# Patient Record
Sex: Male | Born: 1970 | Race: White | Hispanic: No | Marital: Married | Smoking: Never smoker
Health system: Southern US, Community
[De-identification: ages and names within clinical notes are randomized; demographics above are authoritative.]

## PROBLEM LIST (undated history)

## (undated) DIAGNOSIS — E785 Hyperlipidemia, unspecified: Secondary | ICD-10-CM

## (undated) DIAGNOSIS — J302 Other seasonal allergic rhinitis: Secondary | ICD-10-CM

## (undated) DIAGNOSIS — F419 Anxiety disorder, unspecified: Secondary | ICD-10-CM

## (undated) HISTORY — DX: Hyperlipidemia, unspecified: E78.5

## (undated) HISTORY — DX: Other seasonal allergic rhinitis: J30.2

## (undated) HISTORY — DX: Anxiety disorder, unspecified: F41.9

---

## 2001-06-19 ENCOUNTER — Encounter: Admission: RE | Admit: 2001-06-19 | Discharge: 2001-06-19 | Payer: Self-pay | Admitting: Otolaryngology

## 2001-06-19 ENCOUNTER — Encounter: Payer: Self-pay | Admitting: Otolaryngology

## 2005-12-09 ENCOUNTER — Encounter (INDEPENDENT_AMBULATORY_CARE_PROVIDER_SITE_OTHER): Payer: Self-pay | Admitting: *Deleted

## 2005-12-09 ENCOUNTER — Ambulatory Visit (HOSPITAL_COMMUNITY): Admission: RE | Admit: 2005-12-09 | Discharge: 2005-12-09 | Payer: Self-pay | Admitting: General Surgery

## 2008-01-28 ENCOUNTER — Emergency Department (HOSPITAL_COMMUNITY): Admission: EM | Admit: 2008-01-28 | Discharge: 2008-01-28 | Payer: Self-pay | Admitting: Emergency Medicine

## 2010-09-21 NOTE — Op Note (Signed)
Dustin Phelps, Dustin Phelps            ACCOUNT NO.:  [id number]   MEDICAL RECORD NO.:  [id number]          PATIENT TYPE:  AMB   LOCATION:  DAY                          FACILITY:  [facility]   PHYSICIAN:  Timothy E. Earlene Plater, M.D. DATE OF BIRTH:  [birth year]   DATE OF PROCEDURE:  12/09/2005  DATE OF DISCHARGE:                                 OPERATIVE REPORT   PREOPERATIVE DIAGNOSIS:  Prolapsing hemorrhoids.   POSTOPERATIVE DIAGNOSIS:  Prolapsing hemorrhoids.   PROCEDURE:  Hemorrhoidectomy, band ligation.   SURGEON:  Timothy E. Earlene Plater, M.D.   ANESTHESIA:  General.   HISTORY:  Mr. Supple is otherwise a very healthy 40 year old with no risk  factors for hemorrhoids.  He has a single prolapsing hemorrhoid left lateral  that is annoying, difficulty to cleanse, and getting larger.  He has finally  decided to proceed with surgery after several consultations.  He had been  carefully informed, he was seen and identified today, and the permit is  signed.   DESCRIPTION:  He is taken to the operating room and placed supine.  General  LMA anesthesia provided.  He was placed in lithotomy, perianal area  inspected, and then prepped and draped in the usual fashion.  A large  prolapsing left lateral internal hemorrhoid and large external component  were present.  There was a small external component right posterior and a  second degree internal hemorrhoid right exterior.  Since we have agreed on a  single excision, the anus was _________ injected with Marcaine, epinephrine  and Wydase.  This was massaged in well.  The operating anoscope inserted.  The left lateral hemorrhoid carefully evaluated then excised with a small  ellipse as possible, removing the hemorrhoid and varicosities, undermining  of the edges with removal of further varicosities especially superficially.  The wound was then closed with a running 2-0 chromic suture.  Bleeding was  controlled, and there were no complications.  Sphincter  intact.   A rubber-band ligation was placed on the right posterior internal component.  No other pathology was seen.  No complications.  Counts correct.  He  tolerated it well.  Gelfoam gauze and dry sterile dressing applied.   Careful written and verbal instructions given to him and his wife and will  be seen and followed as an outpatient.      Timothy E. Earlene Plater, M.D.  Electronically Signed     TED/MEDQ  D:  12/09/2005  T:  12/09/2005  Job:  [id number]   cc:   Donia Guiles, M.D.  Fax: [phone]

## 2011-02-11 ENCOUNTER — Inpatient Hospital Stay (INDEPENDENT_AMBULATORY_CARE_PROVIDER_SITE_OTHER)
Admission: RE | Admit: 2011-02-11 | Discharge: 2011-02-11 | Disposition: A | Discharge status: Home / Self Care | Payer: 59 | Source: Ambulatory Visit | Attending: Family Medicine | Admitting: Family Medicine

## 2011-02-11 ENCOUNTER — Ambulatory Visit (INDEPENDENT_AMBULATORY_CARE_PROVIDER_SITE_OTHER): Payer: 59

## 2011-02-11 DIAGNOSIS — J45909 Unspecified asthma, uncomplicated: Secondary | ICD-10-CM

## 2012-04-09 ENCOUNTER — Emergency Department (INDEPENDENT_AMBULATORY_CARE_PROVIDER_SITE_OTHER)
Admission: EM | Admit: 2012-04-09 | Discharge: 2012-04-09 | Disposition: A | Discharge status: Home / Self Care | Payer: Worker's Compensation | Source: Home / Self Care | Attending: Emergency Medicine | Admitting: Emergency Medicine

## 2012-04-09 ENCOUNTER — Emergency Department (INDEPENDENT_AMBULATORY_CARE_PROVIDER_SITE_OTHER): Payer: Worker's Compensation

## 2012-04-09 ENCOUNTER — Encounter (HOSPITAL_COMMUNITY): Payer: Self-pay | Admitting: *Deleted

## 2012-04-09 DIAGNOSIS — S61219A Laceration without foreign body of unspecified finger without damage to nail, initial encounter: Secondary | ICD-10-CM

## 2012-04-09 DIAGNOSIS — S61209A Unspecified open wound of unspecified finger without damage to nail, initial encounter: Secondary | ICD-10-CM

## 2012-04-09 DIAGNOSIS — Z23 Encounter for immunization: Secondary | ICD-10-CM

## 2012-04-09 MED ORDER — TETANUS-DIPHTH-ACELL PERTUSSIS 5-2.5-18.5 LF-MCG/0.5 IM SUSP
0.5000 mL | Freq: Once | INTRAMUSCULAR | Status: AC
  Administered 2012-04-09: 0.5 mL via INTRAMUSCULAR

## 2012-04-09 MED ORDER — TETANUS-DIPHTH-ACELL PERTUSSIS 5-2.5-18.5 LF-MCG/0.5 IM SUSP
INTRAMUSCULAR | Status: AC
  Filled 2012-04-09: qty 0.5

## 2012-04-09 NOTE — ED Notes (Signed)
Dressed with coverlet bandaid and 4 prong finger splint applied with coban.

## 2012-04-09 NOTE — ED Provider Notes (Signed)
Chief Complaint  Patient presents with  . 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 He has an evulsion of the pad of the finger. This is very superficial and it appears to be some blood under the flap. He states the flap was completely peeled back initially. It's painful right now to touch but he has a full range of motion of all his joints and no numbness or tingling.  Review of Systems:  Other than noted above, the patient denies any of the following symptoms: Systemic:  No fever or chills. Musculoskeletal:  No joint pain or decreased range of motion. Neuro:  No numbness, tingling, or weakness.  PMFSH:  Past medical history, family history, social history, meds, and allergies were reviewed.  Physical Exam:   Vital signs:  BP 164/72  Pulse 69  Temp 98.6 F (37 C) (Oral)  Resp 16  SpO2 99% Ext:  There is a 1 cm flap laceration of the tip of the finger. This is very superficial involving only the superficial layers of the skin. It's mildly tender to palpation. There appears to be some blood underneath the flap.  All joints had a full ROM without pain.  Pulses were full.  Good capillary refill in all digits.  No edema. Neurological:  Alert and oriented.  No muscle weakness.  Sensation was intact to light touch.   Procedure: Verbal informed consent was obtained.  The patient was informed of the risks and benefits of the procedure and understands and accepts.  Identity of the patient was verified verbally and by wristband.   The laceration area described above was prepped with saline. All visible blood was drained out manually. This is too thin to sew with sutures, so it was glued with Dermabond. The patient tolerated this well. He was given a finger splint to use. I suggested he let the Dermabond come off on its own. He can wash with soap and water. He did not need to  apply any antibiotic ointment.  Medications given in [facility]:  He was given a Tdap vaccine.  Assessment:  The encounter diagnosis was Finger laceration.  Plan:   1.  The following meds were prescribed:   New Prescriptions   No medications on file   2.  The patient was instructed in wound care and pain control, and handouts were given. 3.  The patient was told to return if any sign of infection.     Reuben Likes, MD 04/09/12 2139

## 2012-04-09 NOTE — ED Notes (Signed)
Dropped 125 lbs. 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  Avulsion laceration to tip of finger ventral surface with blood under flap.  Good ROM @ DIP joint.  Bleeding stopped.

## 2012-12-01 IMAGING — CR DG CHEST 2V
3 series · 3 of 3 positions shown · non-contrast
Comparison: 01/28/2008.

CLINICAL DATA: Cough. Shortness of breath.  Nonsmoker.

CHEST - 2 VIEW

[view not recorded (1 of 3)]
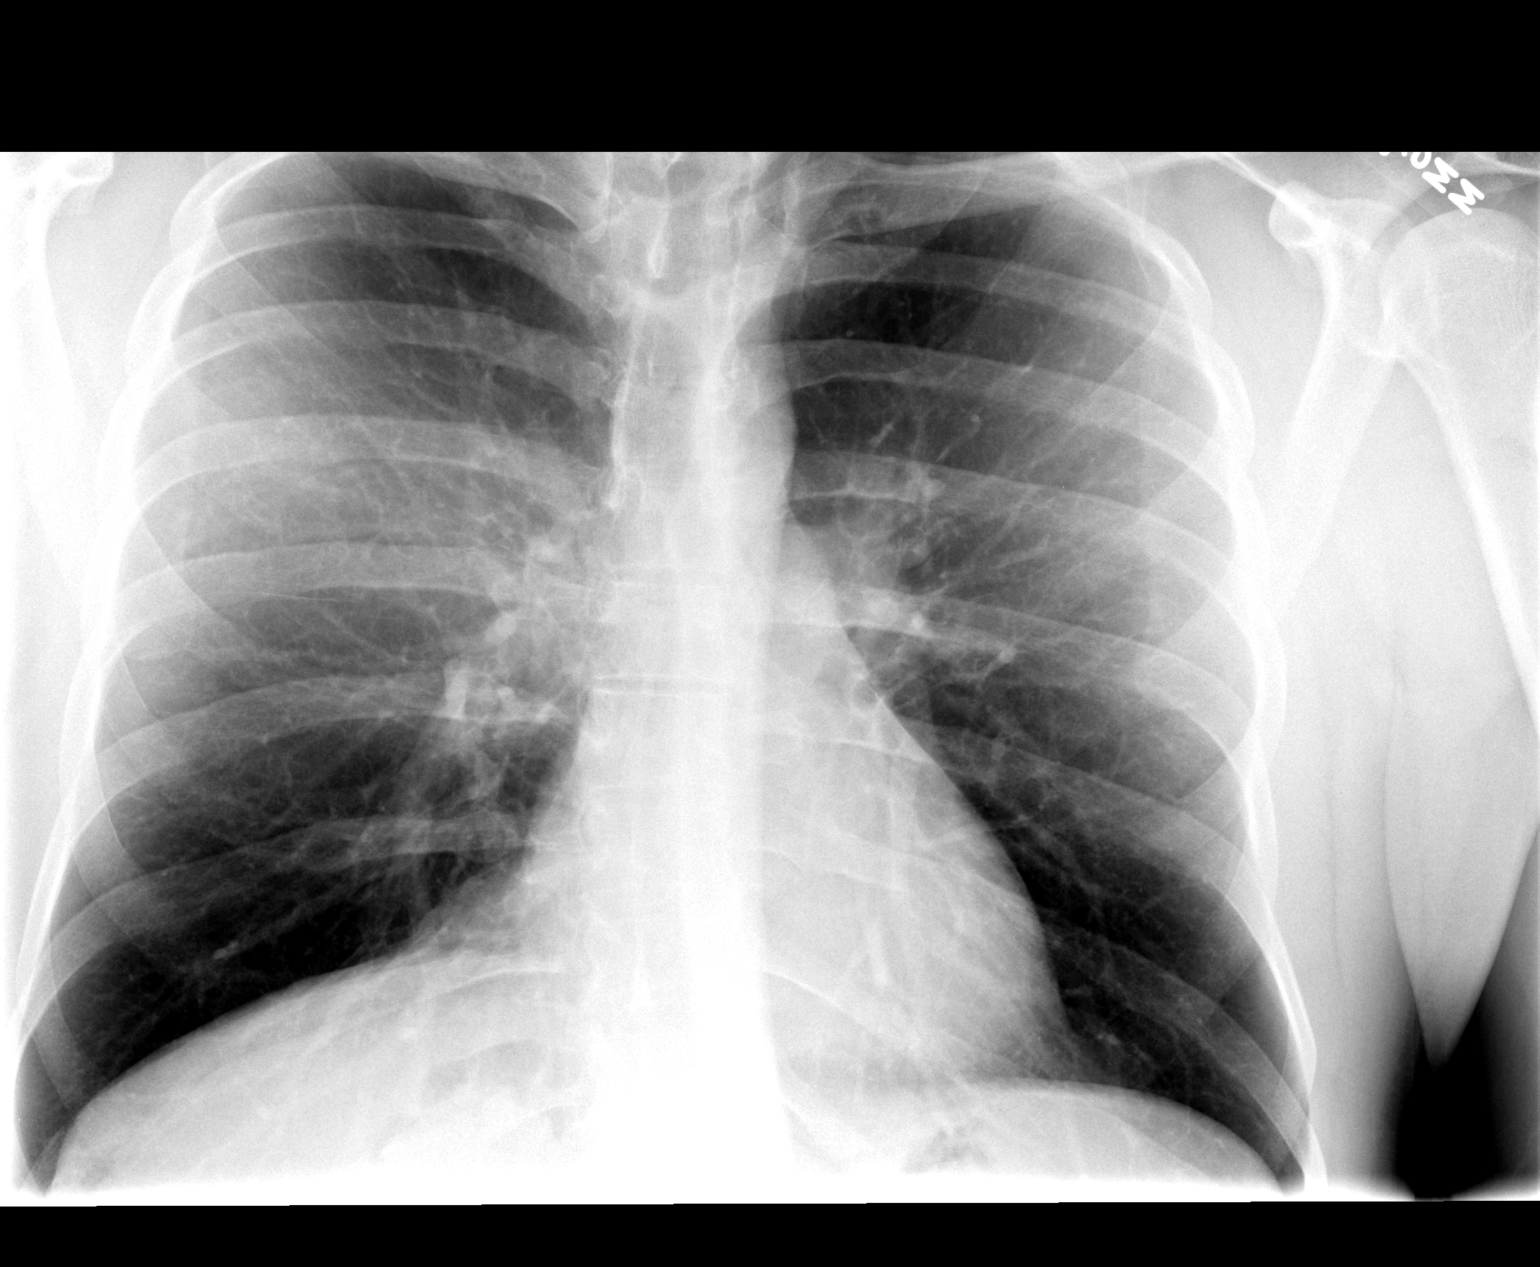

[view not recorded (2 of 3)]
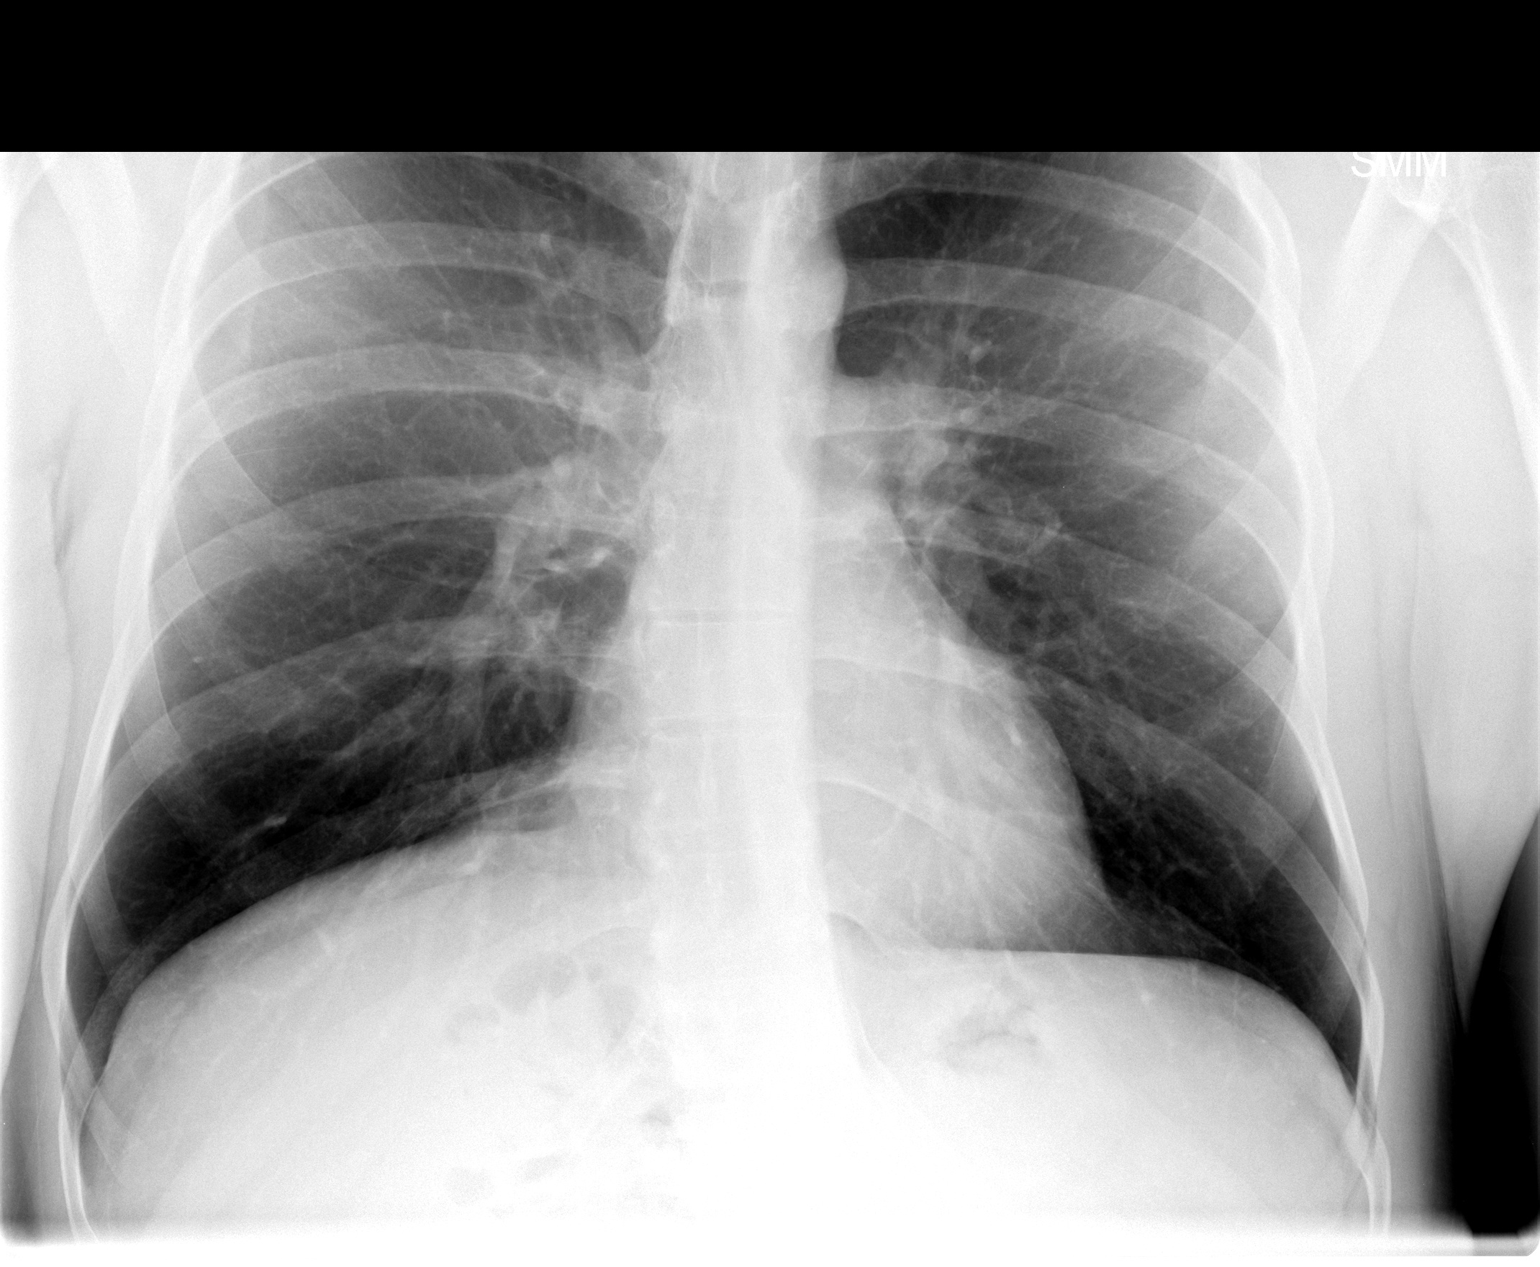

[view not recorded (3 of 3)]
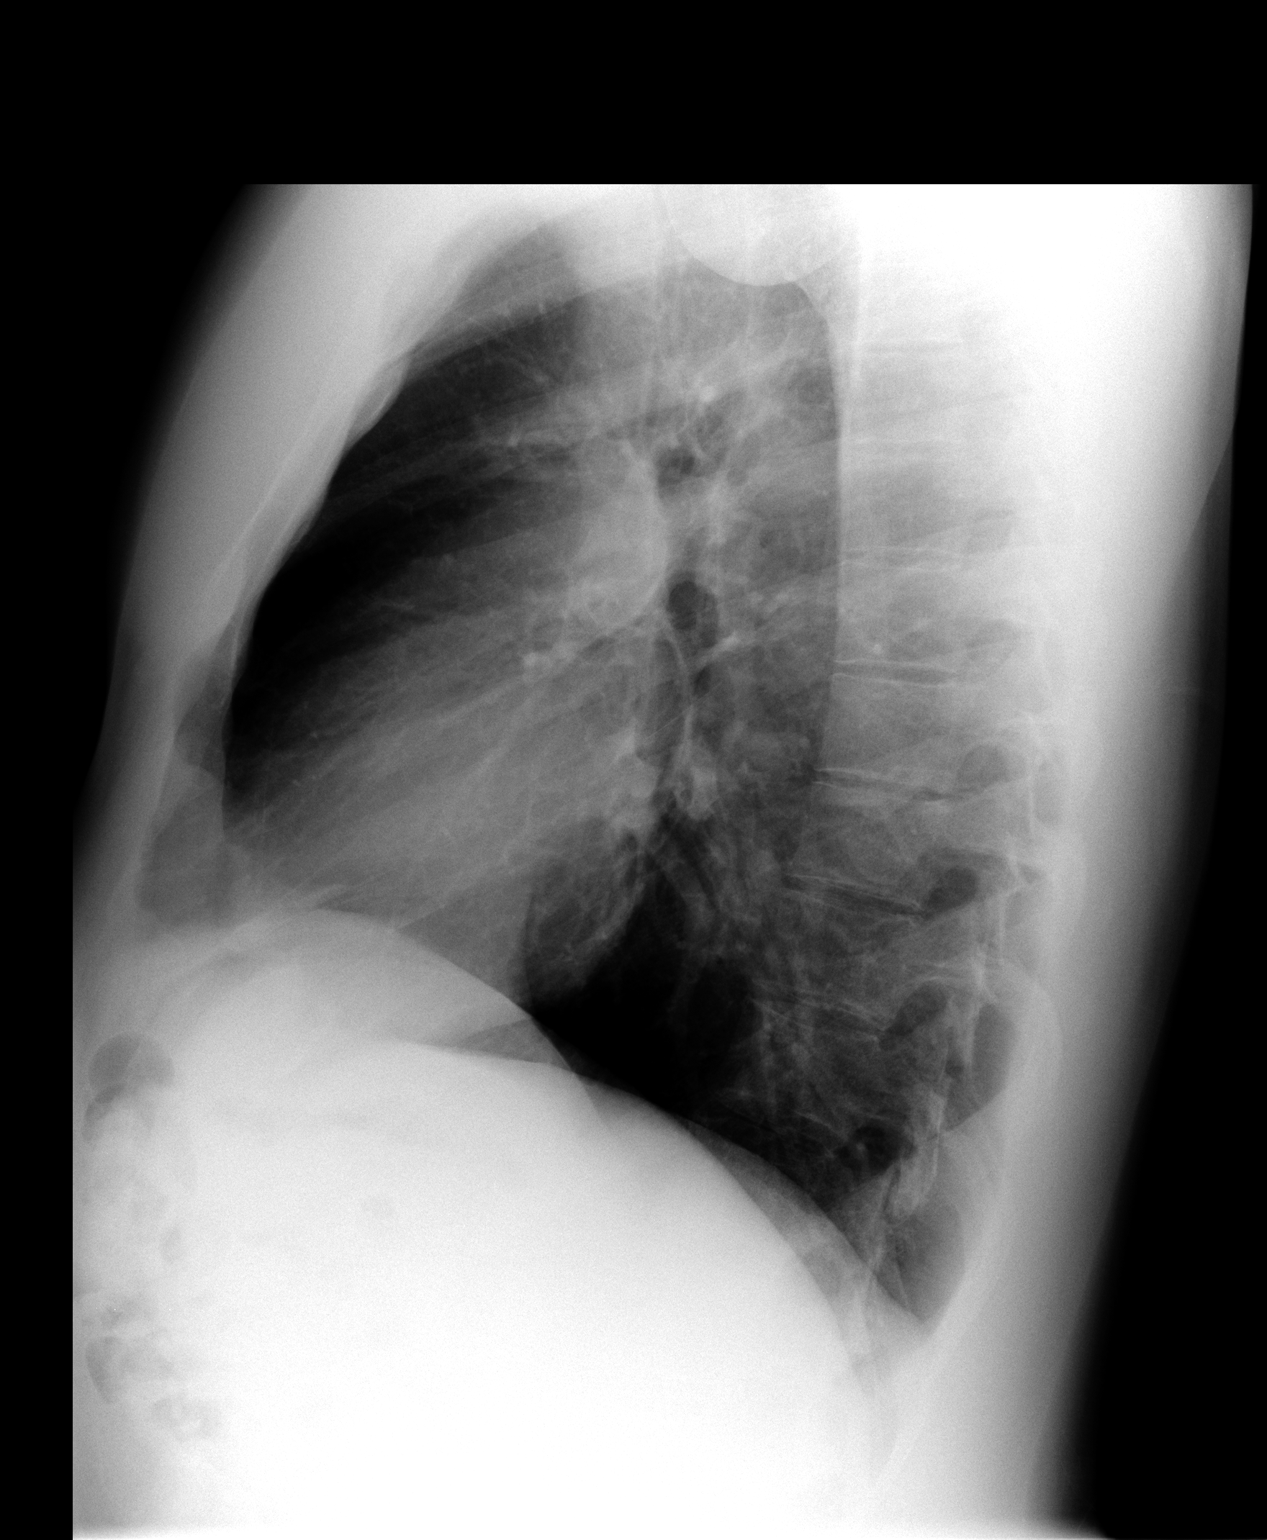

[3 of 3 positions shown; findings below may reference images not displayed]

FINDINGS: No infiltrate, congestive heart failure or pneumothorax.
Mild dextroscoliosis thoracic spine.  Mediastinal and cardiac
silhouette stable and within normal limits.
IMPRESSION: No acute abnormality.

## 2016-05-14 DIAGNOSIS — E782 Mixed hyperlipidemia: Secondary | ICD-10-CM | POA: Diagnosis not present

## 2016-05-14 DIAGNOSIS — Z0000 Encounter for general adult medical examination without abnormal findings: Secondary | ICD-10-CM | POA: Diagnosis not present

## 2016-05-14 DIAGNOSIS — R5383 Other fatigue: Secondary | ICD-10-CM | POA: Diagnosis not present

## 2016-09-20 DIAGNOSIS — J329 Chronic sinusitis, unspecified: Secondary | ICD-10-CM | POA: Diagnosis not present

## 2017-05-27 DIAGNOSIS — H5200 Hypermetropia, unspecified eye: Secondary | ICD-10-CM | POA: Diagnosis not present

## 2017-05-30 DIAGNOSIS — I10 Essential (primary) hypertension: Secondary | ICD-10-CM | POA: Diagnosis not present

## 2017-05-30 DIAGNOSIS — Z0000 Encounter for general adult medical examination without abnormal findings: Secondary | ICD-10-CM | POA: Diagnosis not present

## 2017-06-17 DIAGNOSIS — R945 Abnormal results of liver function studies: Secondary | ICD-10-CM | POA: Diagnosis not present

## 2017-06-17 DIAGNOSIS — R740 Nonspecific elevation of levels of transaminase and lactic acid dehydrogenase [LDH]: Secondary | ICD-10-CM | POA: Diagnosis not present

## 2017-06-19 ENCOUNTER — Other Ambulatory Visit: Payer: Self-pay | Admitting: Family Medicine

## 2017-06-19 DIAGNOSIS — R945 Abnormal results of liver function studies: Principal | ICD-10-CM

## 2017-06-19 DIAGNOSIS — R7989 Other specified abnormal findings of blood chemistry: Secondary | ICD-10-CM

## 2017-06-30 ENCOUNTER — Other Ambulatory Visit: Payer: Self-pay

## 2017-07-07 ENCOUNTER — Ambulatory Visit
Admission: RE | Admit: 2017-07-07 | Discharge: 2017-07-07 | Disposition: A | Discharge status: Home / Self Care | Payer: 59 | Source: Ambulatory Visit | Attending: Family Medicine | Admitting: Family Medicine

## 2017-07-07 DIAGNOSIS — R945 Abnormal results of liver function studies: Principal | ICD-10-CM

## 2017-07-07 DIAGNOSIS — R7989 Other specified abnormal findings of blood chemistry: Secondary | ICD-10-CM | POA: Diagnosis not present

## 2017-07-18 DIAGNOSIS — R945 Abnormal results of liver function studies: Secondary | ICD-10-CM | POA: Diagnosis not present

## 2017-08-07 DIAGNOSIS — R945 Abnormal results of liver function studies: Secondary | ICD-10-CM | POA: Diagnosis not present

## 2017-08-29 DIAGNOSIS — R945 Abnormal results of liver function studies: Secondary | ICD-10-CM | POA: Diagnosis not present

## 2017-12-08 DIAGNOSIS — E782 Mixed hyperlipidemia: Secondary | ICD-10-CM | POA: Diagnosis not present

## 2017-12-08 DIAGNOSIS — I10 Essential (primary) hypertension: Secondary | ICD-10-CM | POA: Diagnosis not present

## 2018-01-06 DIAGNOSIS — R7301 Impaired fasting glucose: Secondary | ICD-10-CM | POA: Diagnosis not present

## 2018-01-06 DIAGNOSIS — N179 Acute kidney failure, unspecified: Secondary | ICD-10-CM | POA: Diagnosis not present

## 2018-06-17 DIAGNOSIS — I10 Essential (primary) hypertension: Secondary | ICD-10-CM | POA: Diagnosis not present

## 2018-06-17 DIAGNOSIS — Z0000 Encounter for general adult medical examination without abnormal findings: Secondary | ICD-10-CM | POA: Diagnosis not present

## 2018-06-17 DIAGNOSIS — R7303 Prediabetes: Secondary | ICD-10-CM | POA: Diagnosis not present

## 2019-11-15 ENCOUNTER — Other Ambulatory Visit: Payer: Self-pay | Admitting: Family Medicine

## 2019-11-15 ENCOUNTER — Ambulatory Visit
Admission: RE | Admit: 2019-11-15 | Discharge: 2019-11-15 | Disposition: A | Discharge status: Home / Self Care | Payer: 59 | Source: Ambulatory Visit | Attending: Family Medicine | Admitting: Family Medicine

## 2019-11-15 DIAGNOSIS — R0602 Shortness of breath: Secondary | ICD-10-CM

## 2020-09-26 ENCOUNTER — Encounter: Payer: Self-pay | Admitting: Gastroenterology

## 2020-11-01 ENCOUNTER — Other Ambulatory Visit: Payer: Self-pay

## 2020-11-01 ENCOUNTER — Ambulatory Visit (AMBULATORY_SURGERY_CENTER): Payer: 59

## 2020-11-01 VITALS — Ht 71.0 in | Wt 205.0 lb

## 2020-11-01 DIAGNOSIS — Z1211 Encounter for screening for malignant neoplasm of colon: Secondary | ICD-10-CM

## 2020-11-01 MED ORDER — SUTAB 1479-225-188 MG PO TABS: 1.0000 | ORAL_TABLET | ORAL | 0 refills | Status: DC

## 2020-11-01 NOTE — Progress Notes (Signed)
Pre visit completed via phone call; Patient verified name, DOB, and address;  No egg or soy allergy known to patient  No issues with past sedation with any surgeries or procedures Patient denies ever being told they had issues or difficulty with intubation  No FH of Malignant Hyperthermia No diet pills per patient No home 02 use per patient  No blood thinners per patient  Pt denies issues with constipation  No A fib or A flutter  COVID 19 guidelines implemented in PV today with Pt and RN  Pt is fully vaccinated for Covid x 2;  Coupon given to pt in PV today, Code to Pharmacy and  NO PA's for preps discussed with pt in PV today  Discussed with pt there will be an out-of-pocket cost for prep and that varies from $0 to 70 dollars   Due to the COVID-19 pandemic we are asking patients to follow certain guidelines.  Pt aware of COVID protocols and [facility] guidelines

## 2020-11-20 ENCOUNTER — Encounter: Payer: Self-pay | Admitting: Gastroenterology

## 2020-11-20 ENCOUNTER — Other Ambulatory Visit: Payer: Self-pay

## 2020-11-20 ENCOUNTER — Ambulatory Visit (AMBULATORY_SURGERY_CENTER): Payer: 59 | Admitting: Gastroenterology

## 2020-11-20 VITALS — BP 121/63 | HR 79 | Temp 97.3°F | Resp 16 | Ht 71.0 in | Wt 205.0 lb

## 2020-11-20 DIAGNOSIS — K635 Polyp of colon: Secondary | ICD-10-CM

## 2020-11-20 DIAGNOSIS — D123 Benign neoplasm of transverse colon: Secondary | ICD-10-CM

## 2020-11-20 DIAGNOSIS — K640 First degree hemorrhoids: Secondary | ICD-10-CM

## 2020-11-20 DIAGNOSIS — Z1211 Encounter for screening for malignant neoplasm of colon: Secondary | ICD-10-CM

## 2020-11-20 DIAGNOSIS — K5730 Diverticulosis of large intestine without perforation or abscess without bleeding: Secondary | ICD-10-CM

## 2020-11-20 MED ORDER — SODIUM CHLORIDE 0.9 % IV SOLN: 500.0000 mL | Freq: Once | INTRAVENOUS | Status: DC

## 2020-11-20 NOTE — Progress Notes (Signed)
Called to room to assist during endoscopic procedure.  Patient ID and intended procedure confirmed with present staff. Received instructions for my participation in the procedure from the performing physician.  

## 2020-11-20 NOTE — Op Note (Signed)
[place] Patient Name: Dustin Phelps Procedure Date: 11/20/2020 7:15 AM MRN: [id number] Endoscopist: Gerrit Heck , MD Age: 50 Referring MD:  Date of Birth: 02-May-1971 Gender: Male Account #: [id number] Procedure:                Colonoscopy Indications:              Screening for colorectal malignant neoplasm, This                            is the patient's first colonoscopy Medicines:                Monitored Anesthesia Care Procedure:                Pre-Anesthesia Assessment:                           - Prior to the procedure, a History and Physical                            was performed, and patient medications and                            allergies were reviewed. The patient's tolerance of                            previous anesthesia was also reviewed. The risks                            and benefits of the procedure and the sedation                            options and risks were discussed with the patient.                            All questions were answered, and informed consent                            was obtained. Prior Anticoagulants: The patient has                            taken no previous anticoagulant or antiplatelet                            agents. ASA Grade Assessment: II - A patient with                            mild systemic disease. After reviewing the risks                            and benefits, the patient was deemed in                            satisfactory condition to undergo the procedure.  After obtaining informed consent, the colonoscope                            was passed under direct vision. Throughout the                            procedure, the patient's blood pressure, pulse, and                            oxygen saturations were monitored continuously. The                            Olympus CF-HQ190L ([id number]) Colonoscope was                            introduced through the anus  and advanced to the the                            terminal ileum. The colonoscopy was performed                            without difficulty. The patient tolerated the                            procedure well. The quality of the bowel                            preparation was good. The terminal ileum, ileocecal                            valve, appendiceal orifice, and rectum were                            photographed. Scope In: 8:06:53 AM Scope Out: 8:28:34 AM Scope Withdrawal Time: 0 hours 17 minutes 5 seconds  Total Procedure Duration: 0 hours 21 minutes 41 seconds  Findings:                 The perianal and digital rectal examinations were                            normal.                           A single (solitary) five mm ulcer was found at the                            ileocecal valve. No bleeding was present. No                            stigmata of recent bleeding were seen. Biopsies                            were taken with a cold forceps for histology.  Estimated blood loss was minimal.                           A 2 mm polyp was found in the transverse colon. The                            polyp was sessile. The polyp was removed with a                            cold biopsy forceps. Resection and retrieval were                            complete. Estimated blood loss was minimal.                           Multiple small and large-mouthed diverticula were                            found in the sigmoid colon, descending colon,                            transverse colon and ascending colon.                           Non-bleeding internal hemorrhoids were found during                            retroflexion. The hemorrhoids were small.                           The terminal ileum appeared normal. Complications:            No immediate complications. Estimated Blood Loss:     Estimated blood loss was minimal. Impression:               - A  single (solitary) ulcer at the ileocecal valve.                            Biopsied.                           - One 2 mm polyp in the transverse colon, removed                            with a cold biopsy forceps. Resected and retrieved.                           - Diverticulosis in the sigmoid colon, in the                            descending colon, in the transverse colon and in                            the ascending colon.                           -  Non-bleeding internal hemorrhoids.                           - The examined portion of the ileum was normal. Recommendation:           - Patient has a contact number available for                            emergencies. The signs and symptoms of potential                            delayed complications were discussed with the                            patient. Return to normal activities tomorrow.                            Written discharge instructions were provided to the                            patient.                           - Resume previous diet.                           - Continue present medications.                           - Await pathology results.                           - Repeat colonoscopy in 5-10 years for surveillance                            based on pathology results.                           - Return to GI office PRN.                           - Use fiber, for example Citrucel, Fibercon, Konsyl                            or Metamucil.                           - Internal hemorrhoids were noted on this study and                            may be amenable to hemorrhoid band ligation. If you                            are interested in further treatment of these  hemorrhoids with band ligation, please contact my                            clinic to set up an appointment for evaluation and                            treatment. Gerrit Heck, MD 11/20/2020 8:33:06 AM

## 2020-11-20 NOTE — Patient Instructions (Signed)
Handout given:  polyps, diverticulosis, hemorrhoids Resume previous diet Continue current medications Await pathology results Use supplemental fiber:  citrucel, fibercon, konsyl  YOU HAD AN ENDOSCOPIC PROCEDURE TODAY AT [facility]:   Refer to the procedure report that was given to you for any specific questions about what was found during the examination.  If the procedure report does not answer your questions, please call your gastroenterologist to clarify.  If you requested that your care partner not be given the details of your procedure findings, then the procedure report has been included in a sealed envelope for you to review at your convenience later.  YOU SHOULD EXPECT: Some feelings of bloating in the abdomen. Passage of more gas than usual.  Walking can help get rid of the air that was put into your GI tract during the procedure and reduce the bloating. If you had a lower endoscopy (such as a colonoscopy or flexible sigmoidoscopy) you may notice spotting of blood in your stool or on the toilet paper. If you underwent a bowel prep for your procedure, you may not have a normal bowel movement for a few days.  Please Note:  You might notice some irritation and congestion in your nose or some drainage.  This is from the oxygen used during your procedure.  There is no need for concern and it should clear up in a day or so.  SYMPTOMS TO REPORT IMMEDIATELY:  Following lower endoscopy (colonoscopy or flexible sigmoidoscopy):  Excessive amounts of blood in the stool  Significant tenderness or worsening of abdominal pains  Swelling of the abdomen that is new, acute  Fever of 100F or higher  For urgent or emergent issues, a gastroenterologist can be reached at any hour by calling [phone]. Do not use MyChart messaging for urgent concerns.   DIET:  We do recommend a small meal at first, but then you may proceed to your regular diet.  Drink plenty of fluids but you should  avoid alcoholic beverages for 24 hours.  ACTIVITY:  You should plan to take it easy for the rest of today and you should NOT DRIVE or use heavy machinery until tomorrow (because of the sedation medicines used during the test).    FOLLOW UP: Our staff will call the number listed on your records 48-72 hours following your procedure to check on you and address any questions or concerns that you may have regarding the information given to you following your procedure. If we do not reach you, we will leave a message.  We will attempt to reach you two times.  During this call, we will ask if you have developed any symptoms of COVID 19. If you develop any symptoms (ie: fever, flu-like symptoms, shortness of breath, cough etc.) before then, please call [phone].  If you test positive for Covid 19 in the 2 weeks post procedure, please call and report this information to [place].    If any biopsies were taken you will be contacted by phone or by letter within the next 1-3 weeks.  Please call us at [phone] if you have not heard about the biopsies in 3 weeks.   SIGNATURES/CONFIDENTIALITY: You and/or your care partner have signed paperwork which will be entered into your electronic medical record.  These signatures attest to the fact that that the information above on your After Visit Summary has been reviewed and is understood.  Full responsibility of the confidentiality of this discharge information lies with you and/or your  care-partner.  

## 2020-11-20 NOTE — Progress Notes (Signed)
To PACU, VSS. Report to [email or web address] 

## 2020-11-20 NOTE — Progress Notes (Signed)
Pt's states no medical or surgical changes since previsit or office visit. 

## 2020-11-22 ENCOUNTER — Telehealth: Payer: Self-pay | Admitting: *Deleted

## 2020-11-22 NOTE — Telephone Encounter (Signed)
  Follow up Call-  Call back number 11/20/2020  Post procedure Call Back phone  # [phone]  Permission to leave phone message Yes  Some recent data might be hidden     Patient questions:  Do you have a fever, pain , or abdominal swelling? No. Pain Score  0 *  Have you tolerated food without any problems? Yes.    Have you been able to return to your normal activities? Yes.    Do you have any questions about your discharge instructions: Diet   No. Medications  No. Follow up visit  No.  Do you have questions or concerns about your Care? No.  Actions: * If pain score is 4 or above: No action needed, pain <4.Have you developed a fever since your procedure? no  2.   Have you had an respiratory symptoms (SOB or cough) since your procedure? no  3.   Have you tested positive for COVID 19 since your procedure no  4.   Have you had any family members/close contacts diagnosed with the COVID 19 since your procedure?  no   If yes to any of these questions please route to Joylene John, RN and Joella Prince, RN

## 2020-11-24 ENCOUNTER — Encounter: Payer: Self-pay | Admitting: Gastroenterology

## 2021-09-04 IMAGING — CR DG CHEST 2V
2 series · 2 of 2 positions shown · non-contrast
Comparison: 02/11/2011

CLINICAL DATA: Shortness of breath, nonproductive cough for 2 weeks

EXAM:
CHEST - 2 VIEW

[w chest pa]
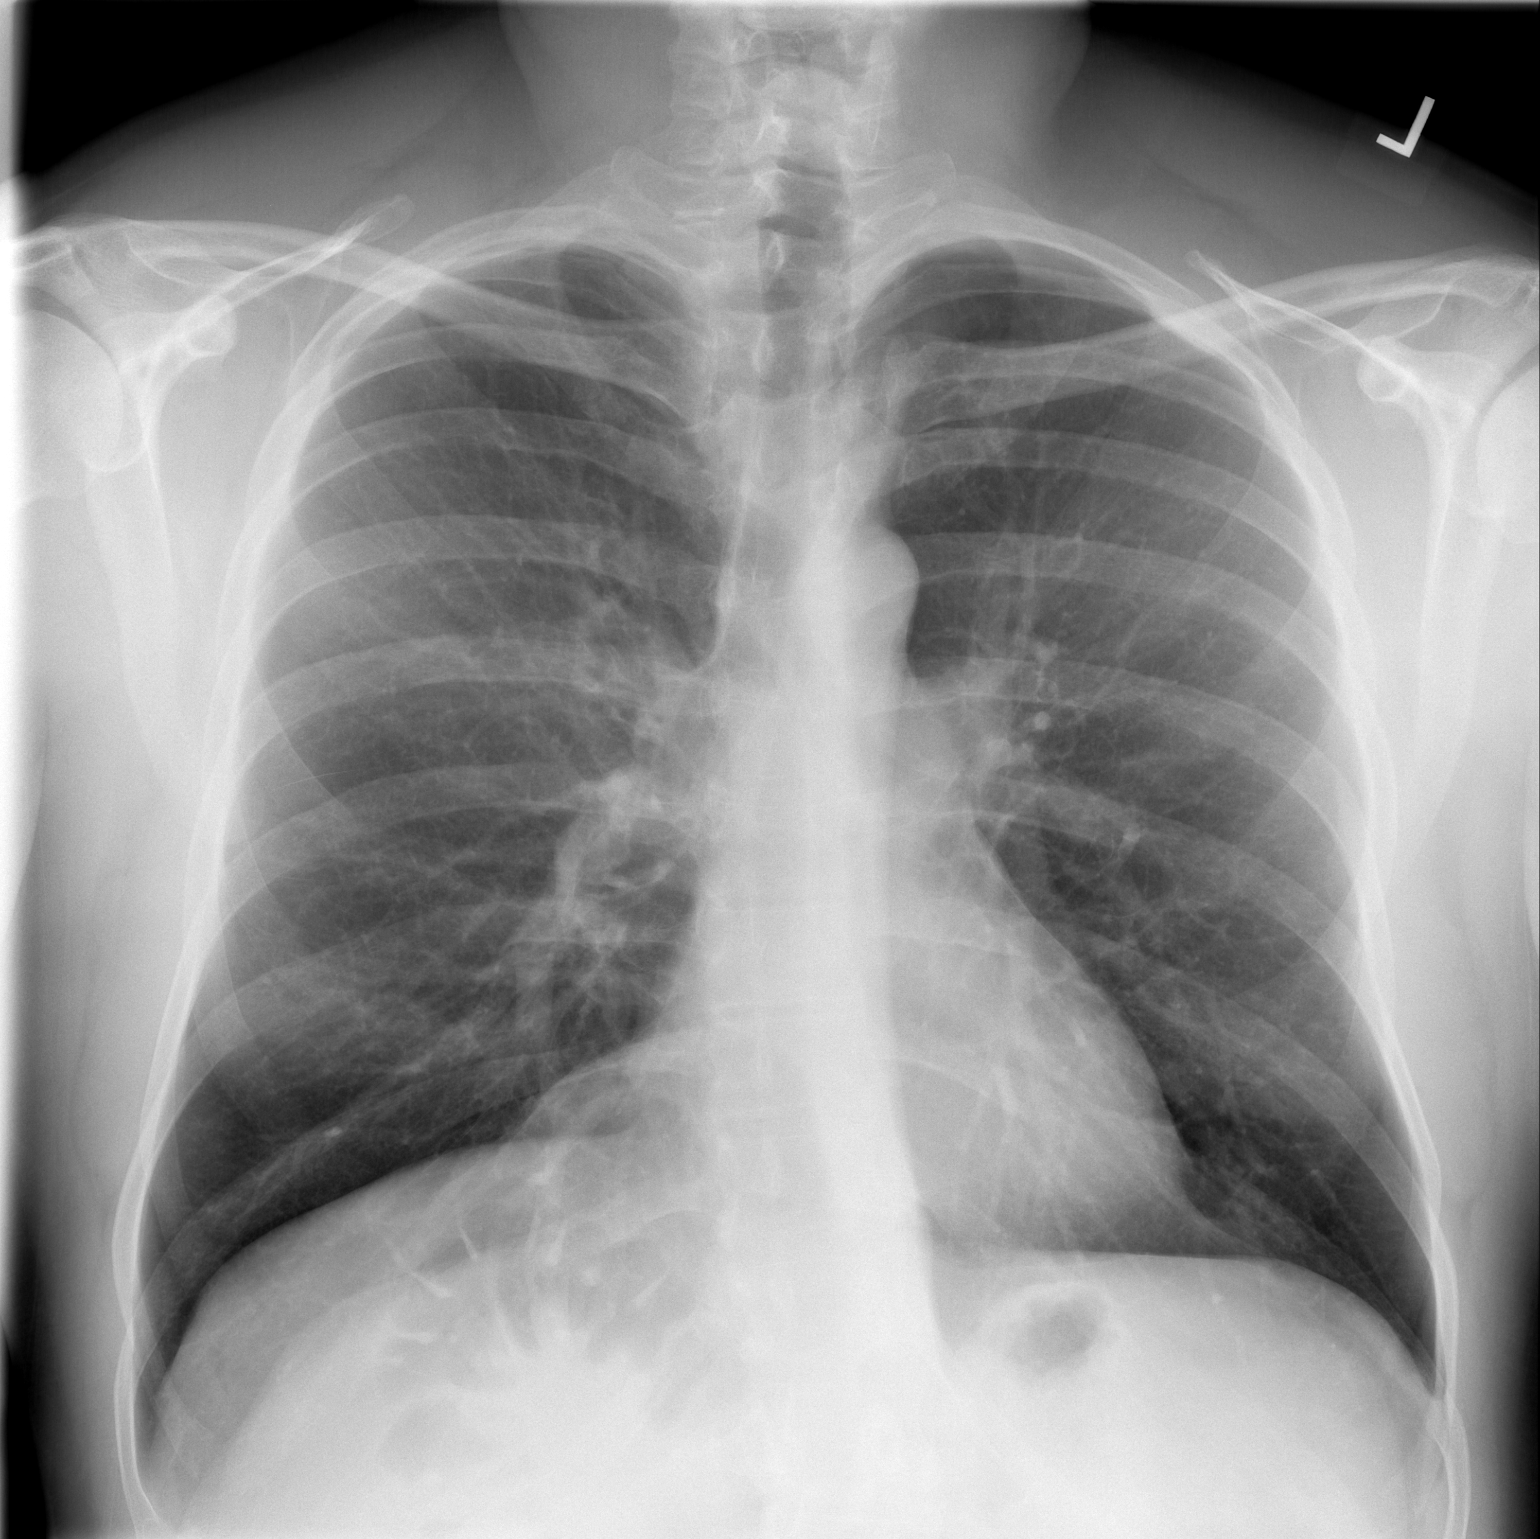

[w chest lat]
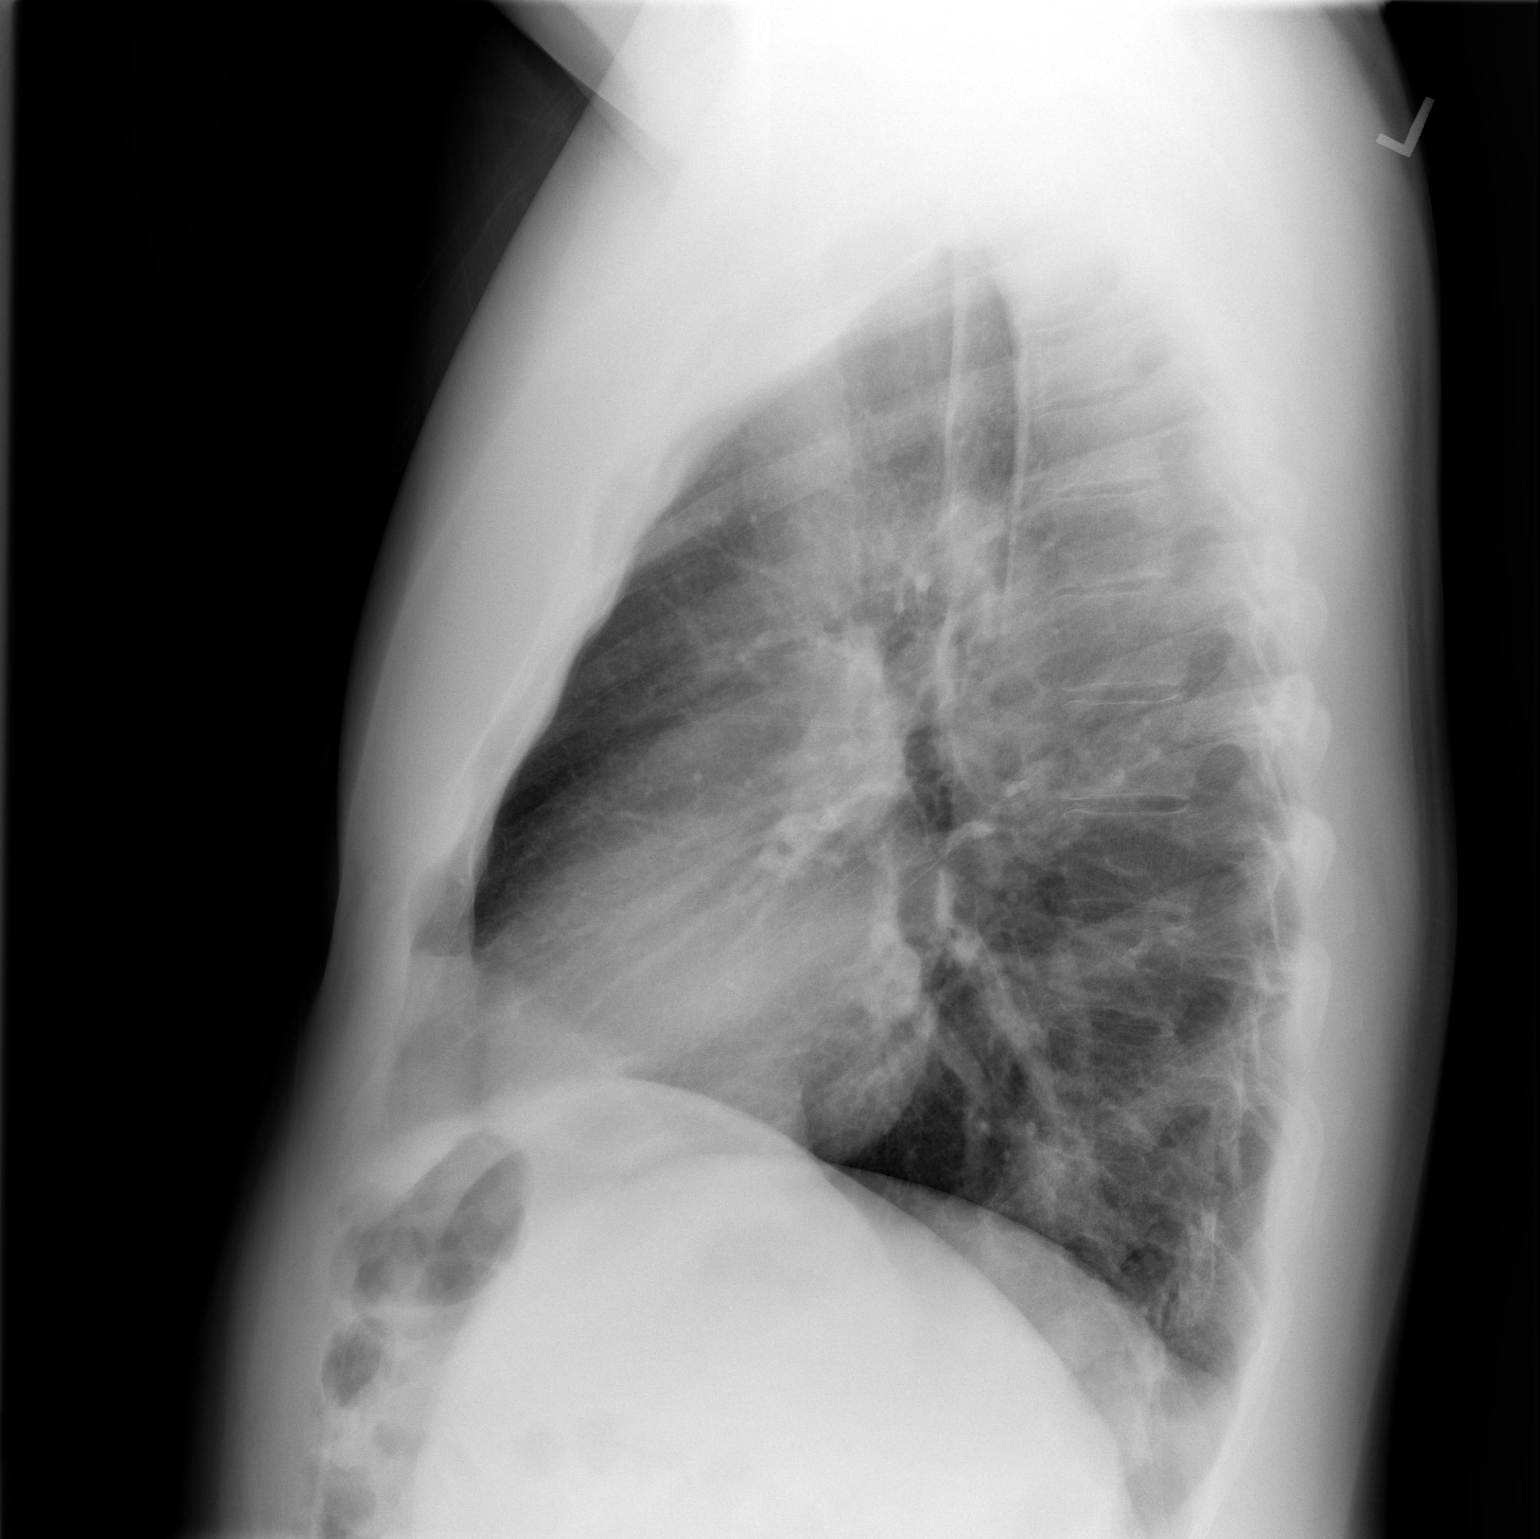

[2 of 2 positions shown; findings below may reference images not displayed]

FINDINGS: The heart size and mediastinal contours are within normal limits.
Both lungs are clear. The visualized skeletal structures are
unremarkable.
IMPRESSION: No active cardiopulmonary disease.

## 2022-04-26 ENCOUNTER — Other Ambulatory Visit: Payer: Self-pay | Admitting: Family Medicine

## 2022-04-26 DIAGNOSIS — I10 Essential (primary) hypertension: Secondary | ICD-10-CM

## 2022-04-26 DIAGNOSIS — E782 Mixed hyperlipidemia: Secondary | ICD-10-CM

## 2022-06-04 ENCOUNTER — Ambulatory Visit
Admission: RE | Admit: 2022-06-04 | Discharge: 2022-06-04 | Disposition: A | Discharge status: Home / Self Care | Payer: 59 | Source: Ambulatory Visit | Attending: Family Medicine | Admitting: Family Medicine

## 2022-06-04 DIAGNOSIS — E782 Mixed hyperlipidemia: Secondary | ICD-10-CM

## 2022-06-04 DIAGNOSIS — I10 Essential (primary) hypertension: Secondary | ICD-10-CM

## 2023-05-09 ENCOUNTER — Ambulatory Visit
Admission: RE | Admit: 2023-05-09 | Discharge: 2023-05-09 | Disposition: A | Discharge status: Home / Self Care | Payer: No Typology Code available for payment source | Source: Ambulatory Visit | Attending: Nurse Practitioner | Admitting: Nurse Practitioner

## 2023-05-09 ENCOUNTER — Other Ambulatory Visit: Payer: Self-pay | Admitting: Nurse Practitioner

## 2023-05-09 DIAGNOSIS — Z0000 Encounter for general adult medical examination without abnormal findings: Secondary | ICD-10-CM

## 2023-10-17 ENCOUNTER — Other Ambulatory Visit: Payer: Self-pay

## 2023-10-17 MED ORDER — AZITHROMYCIN 250 MG PO TABS
ORAL_TABLET | ORAL | 0 refills | Status: AC
  Filled 2023-10-17: qty 6, 5d supply, fill #0

## 2023-10-17 MED ORDER — PROMETHAZINE-DM 6.25-15 MG/5ML PO SYRP
5.0000 mL | ORAL_SOLUTION | Freq: Four times a day (QID) | ORAL | 1 refills | Status: AC | PRN
  Filled 2023-10-17: qty 120, 6d supply, fill #0

## 2023-10-17 MED ORDER — PREDNISONE 20 MG PO TABS
ORAL_TABLET | ORAL | 0 refills | Status: AC
  Filled 2023-10-17: qty 18, 9d supply, fill #0

## 2024-06-22 ENCOUNTER — Institutional Professional Consult (permissible substitution) (INDEPENDENT_AMBULATORY_CARE_PROVIDER_SITE_OTHER): Payer: Self-pay | Admitting: Otolaryngology
